# Patient Record
Sex: Female | Born: 1975 | Race: White | Hispanic: No | Smoking: Current every day smoker
Health system: Southern US, Community
[De-identification: ages and names within clinical notes are randomized; demographics above are authoritative.]

## PROBLEM LIST (undated history)

## (undated) HISTORY — PX: ABDOMINAL HYSTERECTOMY: SHX81

## (undated) HISTORY — PX: LAPAROSCOPIC BILATERAL SALPINGO OOPHERECTOMY: SHX5890

## (undated) HISTORY — PX: CHOLECYSTECTOMY: SHX55

---

## 2004-05-18 ENCOUNTER — Ambulatory Visit (HOSPITAL_COMMUNITY): Admission: RE | Admit: 2004-05-18 | Discharge: 2004-05-18 | Payer: Self-pay | Admitting: Obstetrics and Gynecology

## 2004-05-23 ENCOUNTER — Inpatient Hospital Stay (HOSPITAL_COMMUNITY): Admission: RE | Admit: 2004-05-23 | Discharge: 2004-05-25 | Payer: Self-pay | Admitting: Obstetrics and Gynecology

## 2008-11-05 ENCOUNTER — Emergency Department (HOSPITAL_COMMUNITY): Admission: EM | Admit: 2008-11-05 | Discharge: 2008-11-05 | Payer: Self-pay | Admitting: Emergency Medicine

## 2010-07-15 NOTE — H&P (Signed)
[name], [name] NO.:  [id number]   MEDICAL RECORD NO.:  [id number]          PATIENT TYPE:  AMB   LOCATION:  DAY                           FACILITY:  [facility]   PHYSICIAN:  Tilda Burrow, M.D. DATE OF BIRTH:  1975/10/07   DATE OF ADMISSION:  05/23/2004  DATE OF DISCHARGE:  LH                                HISTORY & PHYSICAL   ADMISSION DIAGNOSIS:  An 11 cm left ovarian mass.   HISTORY OF PRESENT ILLNESS:  This 35 year old female, status post  hysterectomy, has a known 11 cm ovarian mass which has been known to the  patient for at least a year and one-half.  Records have been obtained from  October 2004 when mass was 8 cm in maximum diameter.  She had originally  delayed surgery due to financial considerations and personal obligations. At  this time, she is beginning to have symptomatic pressure from the mass  effect which is palpable through the abdomen.  She has been seen in our  office and agrees to proceed towards the needed surgery.  CA-125 level has  been obtained and is completely normal.  She had CT of the abdomen performed  recently at [facility] which measures the mass at 9.2 x 9.9 x 10  cm, multiseptated with only one slightly thickened septae.  There are two  nodules in the omentum that are adjacent to it, and these are within normal  limits for lymph nodes in the pelvis.  There is no omental caking; there is  no ascites; there are no liver abnormalities.  As stated earlier, the CA-125  is within normal limits.  This case has been discussed in specific with the  on-call GYN oncologist at [facility] on May 20, 2004, who agrees with  this being performed at the local hospital.  The potential for malignancy is  considered low, but the patient is fully aware that it is not zero,  Frozen  section will be obtained, and surgical procedure modified based on these  findings.  If there is any ambivalence about benign or malignant character,  the opposite ovary will be sacrificed.  If it is completely benign in  character, we will inspect the right ovary and consider preservation.  There  are no CT abnormalities of the right ovary identified at this time.  The  patient is aware there may be adhesions to the bowel.  If indeed it is  malignant and bowel involvement is possible, there will be a bowel prep.   PAST MEDICAL HISTORY:  Benign.   PAST GYN HISTORY:  Cervical dysplasia resulting tin hysterectomy.   ADDITIONAL SURGERIES:  1.  C section in 1995.  2.  Cholecystectomy in 1999.  3.  Hysterectomy in 2000.   INJURIES:  Collar bone fracture in 1980s.   MEDICATIONS:  Aspirin and codeine, taken for the pain.   PHYSICAL EXAMINATION:  GENERAL:  Cheerful, alert, oriented, Caucasian  female.  VITAL SIGNS:  Weight 152, blood pressure 100/62.  Urinalysis negative.  HEENT:  Pupils equal, round, and reactive.  NECK:  Supple.  Normal thyroid.  CHEST:  Clear to auscultation.  CARDIAC:  Normal.  LUNGS:  Clear.  ABDOMEN:  Well-healed Pfannenstiel incision and previously mentioned left  adnexal mass.  EXTREMITIES: Normal.  GENITALIA:  Normal.  PELVIC:  Vaginal exam normal secretions.  Cervix and uterus absent.  Mass  reaches down to the vaginal apex.  For the adnexa, see abdominal exam.   IMPRESSION:  Longstanding left ovarian cyst, suspected benign, with patient  understanding fully that absolute likelihood of cancer cannot be ruled out  until we have done the surgery, removed the mass, and had it analyzed.   PLAN:  Exploratory laparotomy with removal of left tube and ovary and  additional surgery as needed on May 23, 2004.      JVF/MEDQ  D:  05/22/2004  T:  05/22/2004  Job:  [id number]

## 2010-07-15 NOTE — Op Note (Signed)
[name], HUSEBY NO.:  [id number]   MEDICAL RECORD NO.:  [id number]          PATIENT TYPE:  INP   LOCATION:  A419                          FACILITY:  [facility]   PHYSICIAN:  Tilda Burrow, M.D. DATE OF BIRTH:  1975/03/30   DATE OF PROCEDURE:  DATE OF DISCHARGE:                                 OPERATIVE REPORT   ADMISSION DIAGNOSIS:  An 11 cm left ovarian mass.   POSTOPERATIVE DIAGNOSIS:  Benign left mucinous cystadenoma.   OPERATION/PROCEDURE:  1.  Bilateral salpingo-oophorectomy.  2.  Incidental appendectomy.   SURGEON:  Tilda Burrow, M.D.   ASSISTANT:  Morrie Sheldon, R.N.   ANESTHESIA:  General.   COMPLICATIONS:  None.   FINDINGS:  Left ovary and tube with large cystic structure easily mobilized.   ESTIMATED BLOOD LOSS:  100 mL.   DETAILS OF PROCEDURE:  The patient was taken to the operating room, prepped  and draped for lower abdominal surgery.  An abdominal incision was made in  midline vertical fashion, excising an ellipse of lax abdominal skin for  cosmetic purposes.  We then  proceeded with opening of the peritoneal cavity  in the midline, obtaining fluid cytologies on the pelvic washings and set  those to one side.  The pelvis was inspected and a smooth cystic structure  found.  There were no excresences, nodularity or evidence of tumor spread.  The ovary was somewhat adherent to the pelvic sidewall and to the sigmoid  colon.  It required careful, sharp surgical dissection to mobilize it  sufficiently to identify the infundibulopelvic ligament structures and the  fimbria on the tissue adherent to the cystic structure.  Once we had  identified the infundibulopelvic ligament, we entered the retroperitoneum,  exposed the ureter to insure that it was safely out of the way, and then  crossclamped the infundibulopelvic ligament and tied it with double ligature  of 0 chromic.  The structure could then be peeled off the left pelvic  sidewall with  ease and was taken down, crossclamping some adhesions from the  specimen to the remnants of the right and left round ligaments, and then the  specimen was taken out intact.  It was sent for frozen section which  returned benign mucinous cystadenoma.   While waiting for the frozen section, we inspected the pelvis further.  As  per the patient's request immediately presurgery, the decision was made to  proceed with full bilateral salpingo-oophorectomy.  We proceeded with  exposure of the right infundibulopelvic ligament, isolating the ureter away  from harm's way, and then clamped the infundibulopelvic ligament,  transecting it and ligating it with 0 Prolene.  The specimen was taken off  the sidewall without further bleeding.  Pelvis was irrigated and laparotomy  equipment removed.  Peritoneum closed with 2-0 Prolene, fascia closed with 0  Vicryl and subcutaneous tissues approximated with 2-0 plain suture followed  by staple closure with good skin results.  The patient went to the recovery  room in good condition.      JVF/MEDQ  D:  06/02/2004  T:  06/02/2004  Job:  [id number]

## 2012-07-02 ENCOUNTER — Inpatient Hospital Stay (HOSPITAL_COMMUNITY)
Admission: AD | Admit: 2012-07-02 | Discharge: 2012-07-02 | Disposition: A | Discharge status: Home / Self Care | Payer: Self-pay | Source: Ambulatory Visit | Attending: Obstetrics and Gynecology | Admitting: Obstetrics and Gynecology

## 2012-08-26 ENCOUNTER — Emergency Department (HOSPITAL_COMMUNITY)
Admission: EM | Admit: 2012-08-26 | Discharge: 2012-08-26 | Disposition: A | Discharge status: Home / Self Care | Payer: Self-pay | Attending: Emergency Medicine | Admitting: Emergency Medicine

## 2012-08-26 ENCOUNTER — Encounter (HOSPITAL_COMMUNITY): Payer: Self-pay | Admitting: *Deleted

## 2012-08-26 DIAGNOSIS — F17200 Nicotine dependence, unspecified, uncomplicated: Secondary | ICD-10-CM | POA: Insufficient documentation

## 2012-08-26 DIAGNOSIS — Z79899 Other long term (current) drug therapy: Secondary | ICD-10-CM | POA: Insufficient documentation

## 2012-08-26 DIAGNOSIS — K047 Periapical abscess without sinus: Secondary | ICD-10-CM | POA: Insufficient documentation

## 2012-08-26 MED ORDER — FLUCONAZOLE 200 MG PO TABS: 200.0000 mg | ORAL_TABLET | Freq: Every day | ORAL | Status: AC

## 2012-08-26 MED ORDER — CLINDAMYCIN HCL 150 MG PO CAPS
150.0000 mg | ORAL_CAPSULE | Freq: Once | ORAL | Status: AC
  Administered 2012-08-26: 150 mg via ORAL
  Filled 2012-08-26: qty 1

## 2012-08-26 MED ORDER — HYDROCODONE-ACETAMINOPHEN 5-325 MG PO TABS
1.0000 | ORAL_TABLET | Freq: Once | ORAL | Status: AC
  Administered 2012-08-26: 1 via ORAL
  Filled 2012-08-26: qty 1

## 2012-08-26 MED ORDER — HYDROCODONE-ACETAMINOPHEN 5-325 MG PO TABS: 1.0000 | ORAL_TABLET | ORAL | Status: AC | PRN

## 2012-08-26 MED ORDER — CLINDAMYCIN HCL 150 MG PO CAPS: 150.0000 mg | ORAL_CAPSULE | Freq: Four times a day (QID) | ORAL | Status: AC

## 2012-08-26 NOTE — ED Provider Notes (Signed)
History    CSN: [id number] Arrival date & time 08/26/12  1740  First MD Initiated Contact with Patient 08/26/12 1801     No chief complaint on file.  (Consider location/radiation/quality/duration/timing/severity/associated sxs/prior Treatment) HPI Comments: [name] is a 37 y.o. Female with  2 day history of dental pain and gingival swelling.   The patient has a history of decay in the tooth involved which has recently started to cause pain and increased swelling.  There has been no fevers,  Chills, nausea or vomiting, also no complaint of difficulty swallowing,  Although chewing makes pain worse.  The patient has taken leftover pencillin for the past 3 days without improvement.         The history is provided by the patient and the spouse.   History reviewed. No pertinent past medical history. Past Surgical History  Procedure Laterality Date  . Cholecystectomy    . Abdominal hysterectomy    . Laparoscopic bilateral salpingo oopherectomy     History reviewed. No pertinent family history. History  Substance Use Topics  . Smoking status: Current Every Day Smoker  . Smokeless tobacco: Not on file  . Alcohol Use: Yes   OB History   Grav Para Term Preterm Abortions TAB SAB Ect Mult Living                 Review of Systems  Constitutional: Negative for fever.  HENT: Positive for dental problem. Negative for sore throat, facial swelling, neck pain and neck stiffness.   Respiratory: Negative for shortness of breath.     Allergies  Aspirin and Codeine  Home Medications   Current Outpatient Rx  Name  Route  Sig  Dispense  Refill  . penicillin v potassium (VEETID) 500 MG tablet   Oral   Take 500 mg by mouth 4 (four) times daily.         . clindamycin (CLEOCIN) 150 MG capsule   Oral   Take 1 capsule (150 mg total) by mouth every 6 (six) hours.   28 capsule   0   . HYDROcodone-acetaminophen (NORCO/VICODIN) 5-325 MG per tablet   Oral   Take 1 tablet by  mouth every 4 (four) hours as needed for pain.   15 tablet   0    BP 106/72  Pulse 73  Temp(Src) 98.5 F (36.9 C) (Oral)  Resp 20  Ht 5\' 4"  (1.626 m)  Wt 155 lb (70.308 kg)  BMI 26.59 kg/m2  SpO2 99% Physical Exam  Constitutional: She is oriented to person, place, and time. She appears well-developed and well-nourished. No distress.  HENT:  Head: Normocephalic and atraumatic.  Right Ear: Tympanic membrane and external ear normal.  Left Ear: Tympanic membrane and external ear normal.  Mouth/Throat: Oropharynx is clear and moist and mucous membranes are normal. No oral lesions. Dental abscesses present.    Modest edema along right lower anterior jaw line.  No erythema  Eyes: Conjunctivae are normal.  Neck: Normal range of motion. Neck supple.  Cardiovascular: Normal rate and normal heart sounds.   Pulmonary/Chest: Effort normal.  Abdominal: She exhibits no distension.  Musculoskeletal: Normal range of motion.  Lymphadenopathy:    She has no cervical adenopathy.  Neurological: She is alert and oriented to person, place, and time.  Skin: Skin is warm and dry. No erythema.  Psychiatric: She has a normal mood and affect.    ED Course  Procedures (including critical care time) Labs Reviewed - No data to  display No results found. 1. Dental abscess     MDM  Patient was encouraged to DC her penicillin, switching to clindamycin.  She's also prescribed hydrocodone.  She was given dental referrals.  The patient appears reasonably screened and/or stabilized for discharge and I doubt any other medical condition or other [facility] requiring further screening, evaluation, or treatment in the ED at this time prior to discharge.   Burgess Amor, PA-C 08/26/12 [phone]

## 2012-08-26 NOTE — ED Provider Notes (Signed)
Medical screening examination/treatment/procedure(s) were performed by non-physician practitioner and as supervising physician I was immediately available for consultation/collaboration.   Elizabella Nolet L Estelle Greenleaf, MD 08/26/12 2248 

## 2012-08-26 NOTE — ED Notes (Signed)
Swelling right lower jaw, odes have dental caries on that side

## 2012-08-26 NOTE — ED Notes (Signed)
Swelling rt jaw for 2 days

## 2015-07-12 ENCOUNTER — Emergency Department (HOSPITAL_COMMUNITY): Payer: Self-pay

## 2015-07-12 ENCOUNTER — Emergency Department (HOSPITAL_COMMUNITY)
Admission: EM | Admit: 2015-07-12 | Discharge: 2015-07-12 | Disposition: A | Discharge status: Home / Self Care | Payer: Self-pay | Attending: Emergency Medicine | Admitting: Emergency Medicine

## 2015-07-12 ENCOUNTER — Encounter (HOSPITAL_COMMUNITY): Payer: Self-pay | Admitting: Emergency Medicine

## 2015-07-12 DIAGNOSIS — F17210 Nicotine dependence, cigarettes, uncomplicated: Secondary | ICD-10-CM | POA: Insufficient documentation

## 2015-07-12 DIAGNOSIS — S42302A Unspecified fracture of shaft of humerus, left arm, initial encounter for closed fracture: Secondary | ICD-10-CM

## 2015-07-12 DIAGNOSIS — Y929 Unspecified place or not applicable: Secondary | ICD-10-CM | POA: Insufficient documentation

## 2015-07-12 DIAGNOSIS — Y9389 Activity, other specified: Secondary | ICD-10-CM | POA: Insufficient documentation

## 2015-07-12 DIAGNOSIS — W010XXA Fall on same level from slipping, tripping and stumbling without subsequent striking against object, initial encounter: Secondary | ICD-10-CM | POA: Insufficient documentation

## 2015-07-12 DIAGNOSIS — S42255A Nondisplaced fracture of greater tuberosity of left humerus, initial encounter for closed fracture: Secondary | ICD-10-CM | POA: Insufficient documentation

## 2015-07-12 DIAGNOSIS — Y999 Unspecified external cause status: Secondary | ICD-10-CM | POA: Insufficient documentation

## 2015-07-12 MED ORDER — OXYCODONE-ACETAMINOPHEN 5-325 MG PO TABS: 1.0000 | ORAL_TABLET | Freq: Four times a day (QID) | ORAL | Status: AC | PRN

## 2015-07-12 MED ORDER — OXYCODONE-ACETAMINOPHEN 5-325 MG PO TABS
1.0000 | ORAL_TABLET | Freq: Once | ORAL | Status: AC
  Administered 2015-07-12: 1 via ORAL
  Filled 2015-07-12: qty 1

## 2015-07-12 NOTE — ED Notes (Signed)
No answer at 1455

## 2015-07-12 NOTE — ED Notes (Addendum)
Pt states she tripped and fell yesterday while helping to push a car. Pt c/o left upper arm pain. Left radial pulse 2+,left arm warm to touch, pt able to wiggle digits.

## 2015-07-12 NOTE — ED Provider Notes (Signed)
CSN: [id number]     Arrival date & time 07/12/15  1353 History  By signing my name below, I, [name], attest that this documentation has been prepared under the direction and in the presence of Kerrie Buffalo, NP. Electronically Signed: Placido Mitchell, ED Scribe. 07/12/2015. 4:24 PM.    Chief Complaint  Patient presents with  . Arm Pain   Patient is a 40 y.o. female presenting with arm pain. The history is provided by the patient. No language interpreter was used.  Arm Pain This is a new problem. The current episode started yesterday. The problem occurs rarely. The problem has not changed since onset.Pertinent negatives include no chest pain, no abdominal pain and no headaches. Nothing relieves the symptoms.    HPI Comments: [name] is a 40 y.o. female who presents to the Emergency Department complaining of waxing and waning, moderate, left upper arm pain x 1 day. She states that she was pushing a vehicle and slipped resulting in a forward fall landing on her left hand. Her pain moves from her left shoulder to her elbow and radiates down to her left hand. Pt has applied ice and take ibuprofen with her last dose last night and denies significant relief. Her pain increases significantly with LUE movement. Pt notes aspirin causes abd pain and codeine causes n/v. She denies a PMHx of HTN or DM and denies taking any regular medications. Pt denies head trauma, LOC, back pain, CP or any other associated symptoms at this time.    History reviewed. No pertinent past medical history. Past Surgical History  Procedure Laterality Date  . Cholecystectomy    . Abdominal hysterectomy    . Laparoscopic bilateral salpingo oopherectomy     No family history on file. Social History  Substance Use Topics  . Smoking status: Current Every Day Smoker -- 1.00 packs/day    Types: Cigarettes  . Smokeless tobacco: None  . Alcohol Use: Yes     Comment: social   OB History    No data available      Review of Systems  Cardiovascular: Negative for chest pain.  Gastrointestinal: Negative for abdominal pain.  Musculoskeletal: Positive for arthralgias. Negative for back pain.  Skin: Negative for wound.  Neurological: Negative for syncope and headaches.  All other systems reviewed and are negative.  Allergies  Aspirin and Codeine  Home Medications   Prior to Admission medications   Medication Sig Start Date End Date Taking? Authorizing Provider  clindamycin (CLEOCIN) 150 MG capsule Take 1 capsule (150 mg total) by mouth every 6 (six) hours. 08/26/12   Burgess Amor, PA-C  HYDROcodone-acetaminophen (NORCO/VICODIN) 5-325 MG per tablet Take 1 tablet by mouth every 4 (four) hours as needed for pain. 08/26/12   Burgess Amor, PA-C  oxyCODONE-acetaminophen (ROXICET) 5-325 MG tablet Take 1 tablet by mouth every 6 (six) hours as needed for severe pain. 07/12/15   Hope Orlene Och, NP  penicillin v potassium (VEETID) 500 MG tablet Take 500 mg by mouth 4 (four) times daily.    Historical Provider, MD   BP 101/69 mmHg  Pulse 78  Temp(Src) 98.4 F (36.9 C) (Oral)  Resp 18  Ht  (1.626 m)  Wt 74.844 kg  BMI 28.31 kg/m2  SpO2 99%    Physical Exam  Constitutional: She is oriented to person, place, and time. She appears well-developed and well-nourished.  HENT:  Head: Normocephalic and atraumatic.  Eyes: EOM are normal.  Neck: Normal range of motion.  Cardiovascular:  Normal rate, regular rhythm and normal heart sounds.  Exam reveals no gallop and no friction rub.   No murmur heard. Pulses:      Radial pulses are 2+ on the right side, and 2+ on the left side.  Pulmonary/Chest: Effort normal and breath sounds normal. No respiratory distress. She has no wheezes. She has no rales. She exhibits no tenderness.  Abdominal: Soft. Bowel sounds are normal. There is no tenderness.  Musculoskeletal: Normal range of motion. She exhibits tenderness.  [facility] of left shoulder. Difficulty  performing the shoulder exam due to limited ROM and pain.   Neurological: She is alert and oriented to person, place, and time.  Grip strength equal bilaterally  Skin: Skin is warm and dry.  Psychiatric: She has a normal mood and affect.  Nursing note and vitals reviewed.   ED Course  Procedures  DIAGNOSTIC STUDIES: Oxygen Saturation is 99% on RA, normal by my interpretation.    COORDINATION OF CARE: 4:20 PM Discussed next steps with pt. She verbalized understanding and is agreeable with the plan.   Labs Review Labs Reviewed - No data to display  Imaging Review Dg Shoulder Left  07/12/2015  CLINICAL DATA:  Tripped and fall wall pushing a car. Left upper arm pain. EXAM: LEFT SHOULDER - 2+ VIEW COMPARISON:  None. FINDINGS: The tip of the greater tuberosity is undercut by linear lucency and there is a small faint linear calcification suspicious for periosteal reaction along the medial margin. Appearance compatible with nondisplaced fracture of the upper margin of the greater tuberosity. AC joint alignment normal.  No other fracture identified. IMPRESSION: 1. Nondisplaced fracture of the greater tuberosity of the left humerus. This is in the vicinity of the supraspinatus insertion. Electronically Signed   By: Gaylyn RongWalter  Liebkemann M.D.   On: 07/12/2015 16:47   I have personally reviewed and evaluated these images as part of my medical decision-making.   MDM  40 y.o. female with left shoulder pain stable for d/c without focal neuro deficits. Arm placed in sling, ice, rest, pain management and f/u with ortho. Discussed with the patient x-ray results and clinical findings and plan of care. All questioned fully answered.   Final diagnoses:  Humerus fracture, left, closed, initial encounter   I personally performed the services described in this documentation, which was scribed in my presence. The recorded information has been reviewed and is accurate.    [id number] Pilgrim LaneHope WordenM Neese, NP 07/14/15  1559  Bethann BerkshireJoseph Zammit, MD 07/19/15 [phone]

## 2015-07-12 NOTE — ED Notes (Signed)
Patient given discharge instruction, verbalized understand. Patient ambulatory out of the department.  

## 2015-07-14 ENCOUNTER — Telehealth: Payer: Self-pay | Admitting: Orthopaedic Surgery

## 2015-07-14 NOTE — Telephone Encounter (Signed)
Patient called today, 07/14/15, inquiring about appointment following Emergency room visit on 07/12/15, for problem of fracture of humerus. Patient asked about self-pay, as no insurance. Discussed Self-pay amount and scheduled appointment.  Patient states will call back today if unable to make it to the appointment.  Stressed importance of follow up fracture care; patient voiced understanding.

## 2015-07-15 ENCOUNTER — Telehealth: Payer: Self-pay | Admitting: Orthopaedic Surgery

## 2015-07-15 ENCOUNTER — Ambulatory Visit: Payer: Self-pay | Admitting: Orthopaedic Surgery

## 2015-07-15 NOTE — Telephone Encounter (Signed)
Patient was scheduled to see Dr. Hilda LiasKeeling this morning at 9 am and she didn't show. I called and left message on machine explaining that it was very important to keep her appointments because she has a fracture and we will not have a doctor in the office after today until Monday. She was told this information when she was scheduled yesterday. I asked that she give our office a call.

## 2017-03-26 IMAGING — DX DG SHOULDER 2+V*L*
3 series · 3 of 3 positions shown · non-contrast
Comparison: None.

CLINICAL DATA: Tripped and fall wall pushing a car. Left upper arm
pain.

EXAM:
LEFT SHOULDER - 2+ VIEW

[shoulder grashey]
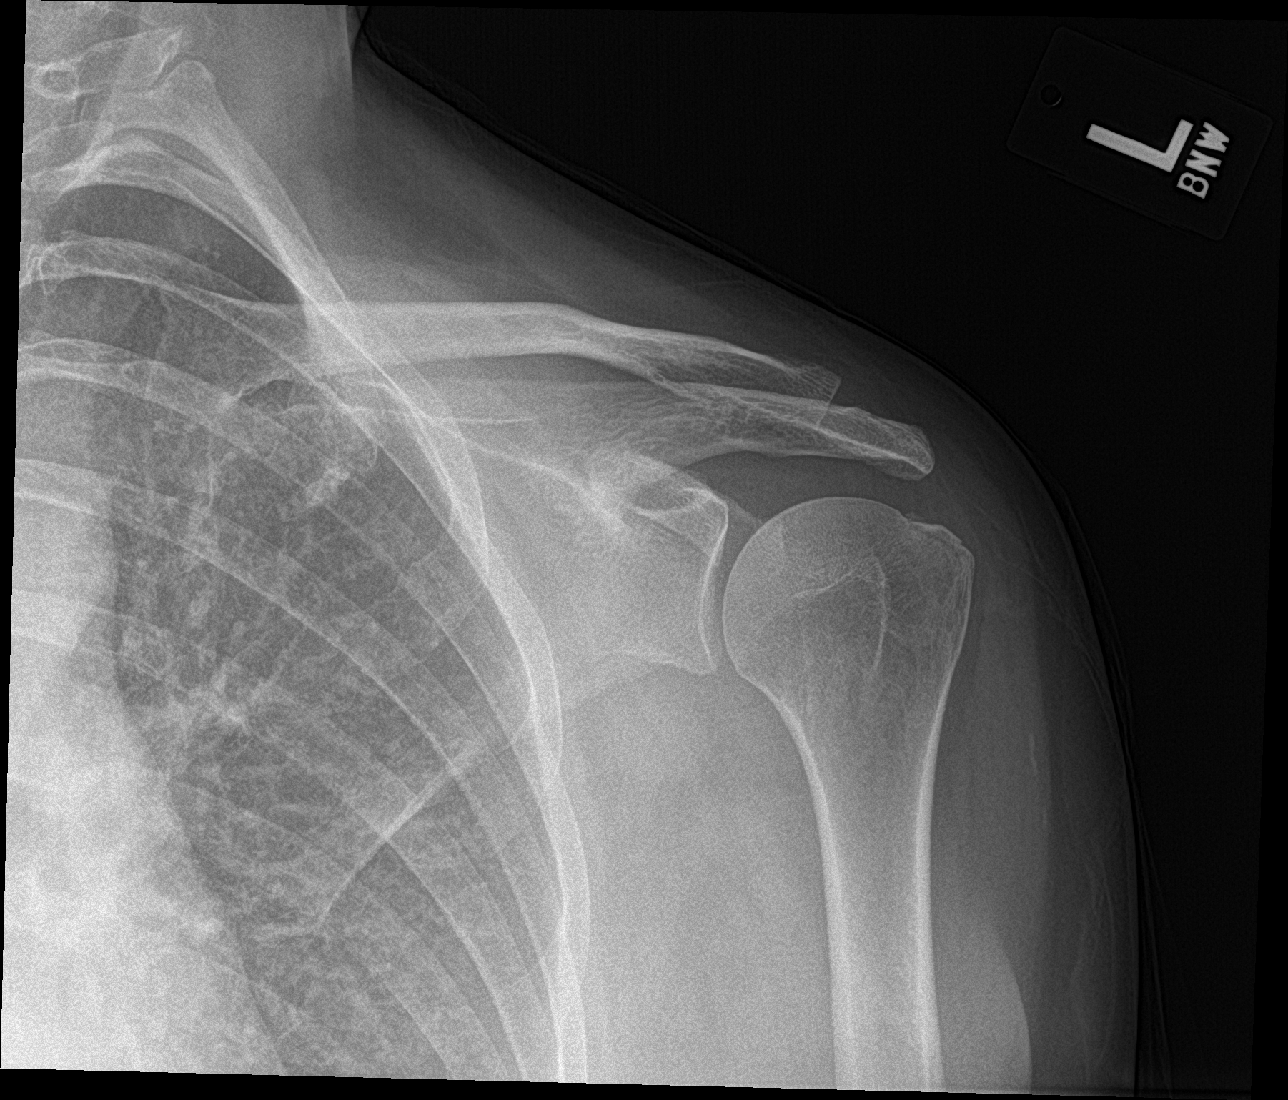

[shoulder y view]
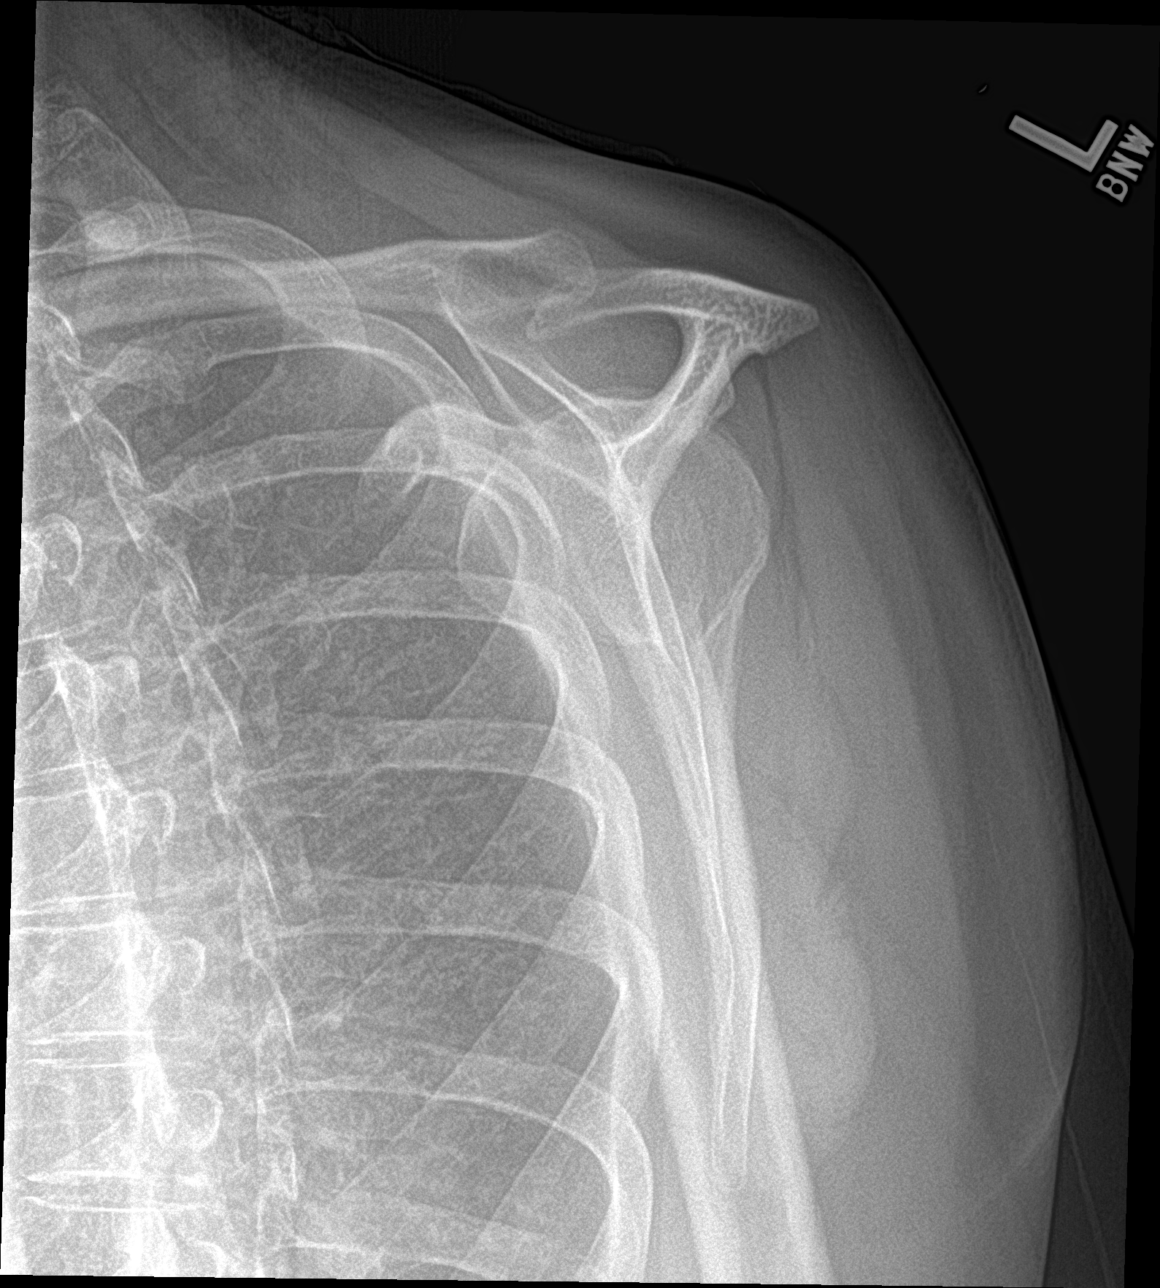

[shoulder axillary]
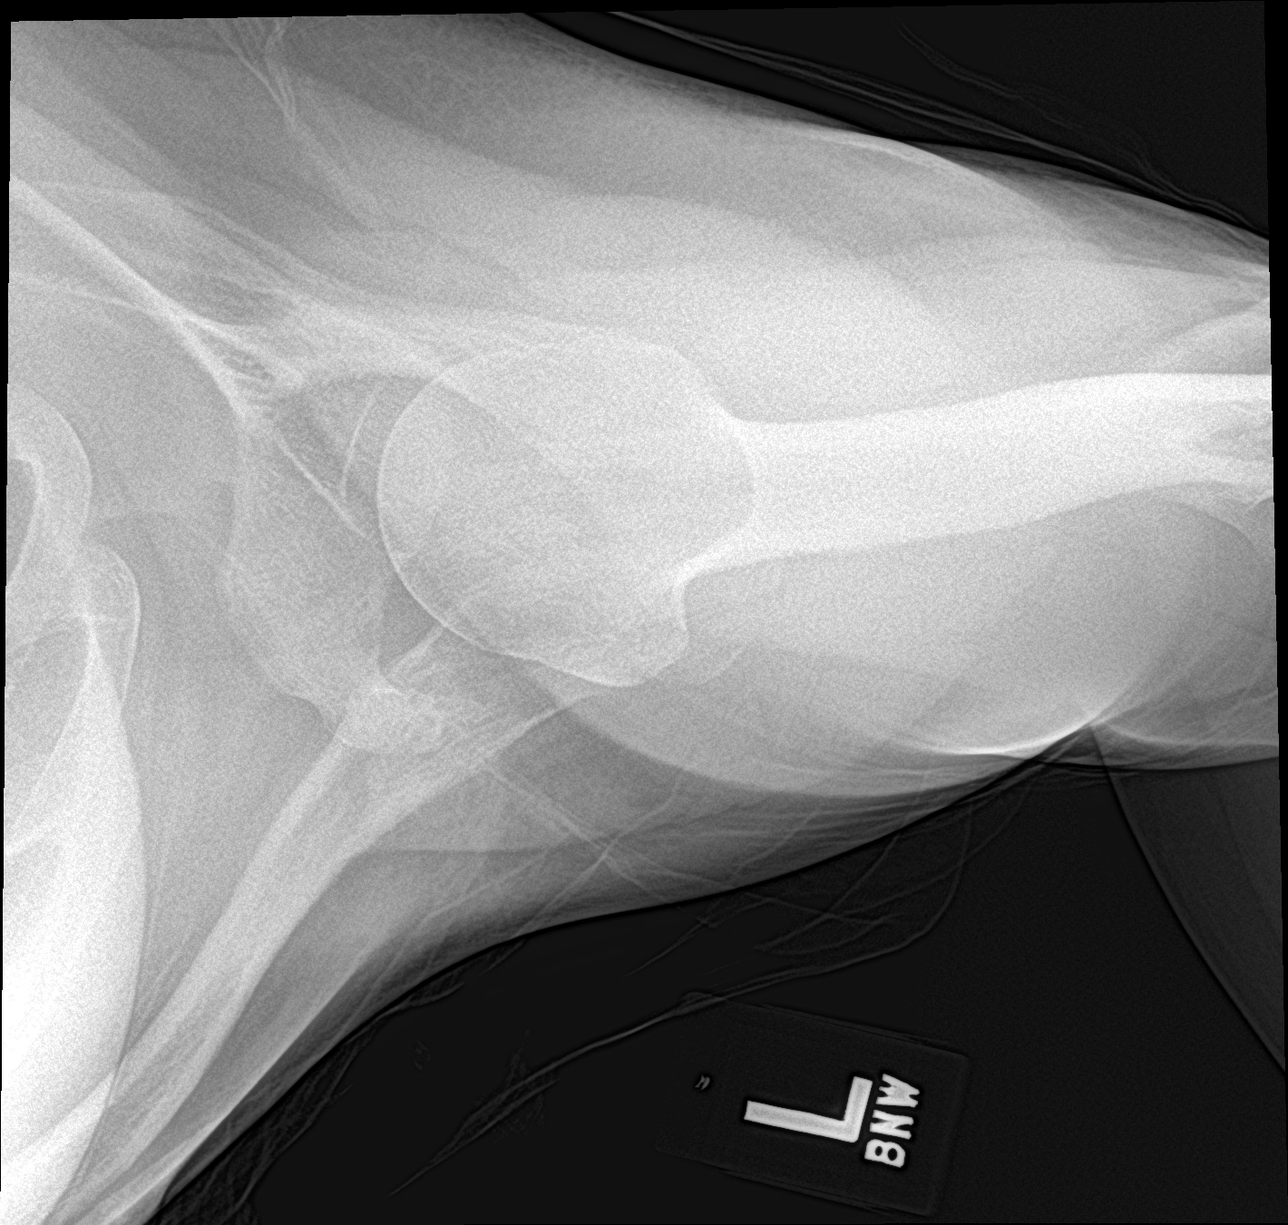

[3 of 3 positions shown; findings below may reference images not displayed]

FINDINGS: The tip of the greater tuberosity is undercut by linear lucency and
there is a small faint linear calcification suspicious for
periosteal reaction along the medial margin. Appearance compatible
with nondisplaced fracture of the upper margin of the greater
tuberosity.

AC joint alignment normal.  No other fracture identified.
IMPRESSION: 1. Nondisplaced fracture of the greater tuberosity of the left
humerus. This is in the vicinity of the supraspinatus insertion.
